# Patient Record
Sex: Male | Born: 1960 | Race: White | Hispanic: No | Marital: Single | Smoking: Never smoker
Health system: Southern US, Community
[De-identification: ages and names within clinical notes are randomized; demographics above are authoritative.]

---

## 2005-11-07 ENCOUNTER — Ambulatory Visit: Payer: Self-pay | Admitting: Family Medicine

## 2006-08-18 ENCOUNTER — Encounter: Admission: RE | Admit: 2006-08-18 | Discharge: 2006-08-18 | Payer: Self-pay | Admitting: Occupational Medicine

## 2008-08-17 ENCOUNTER — Encounter: Admission: RE | Admit: 2008-08-17 | Discharge: 2008-08-17 | Payer: Self-pay | Admitting: Occupational Medicine

## 2010-09-19 IMAGING — CR DG CHEST 2V
2 series · 2 of 2 positions shown · non-contrast
Comparison: 08/18/2006

CLINICAL DATA: Cough/physical clearance

CHEST - 2 VIEW

[view not recorded (1 of 2)]
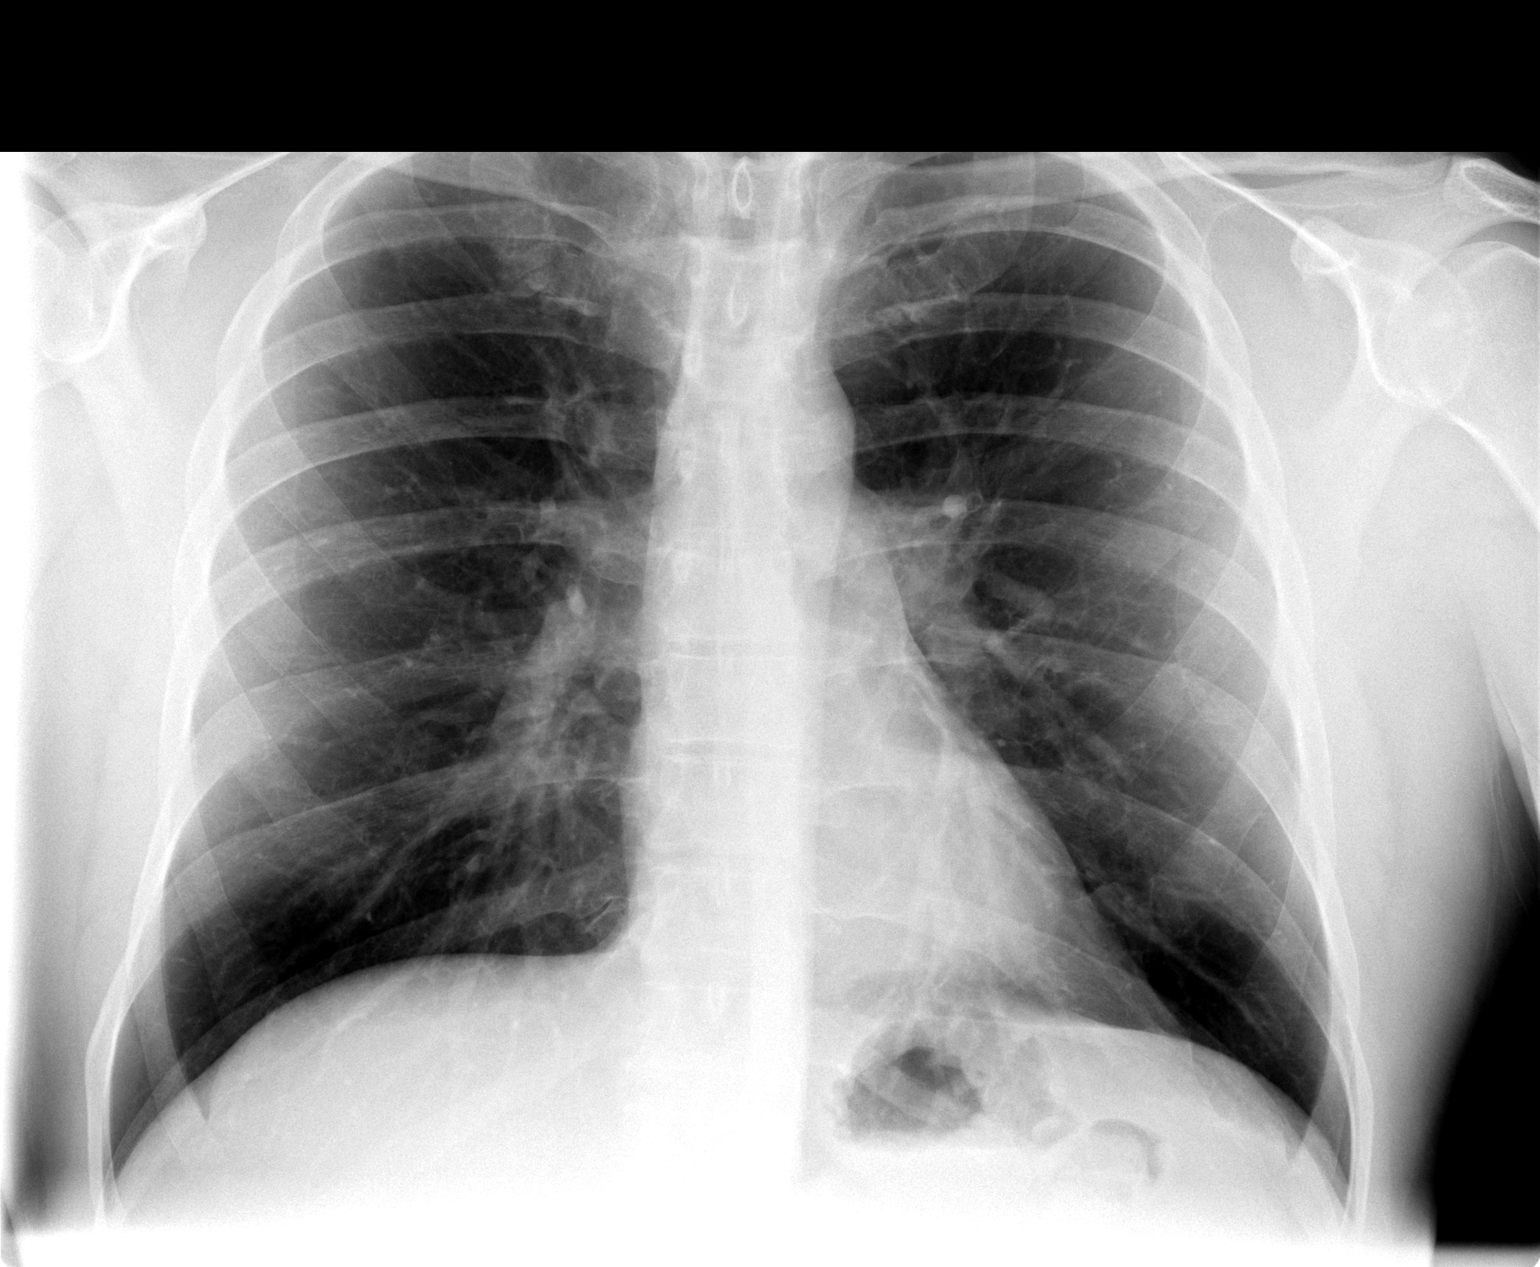

[view not recorded (2 of 2)]
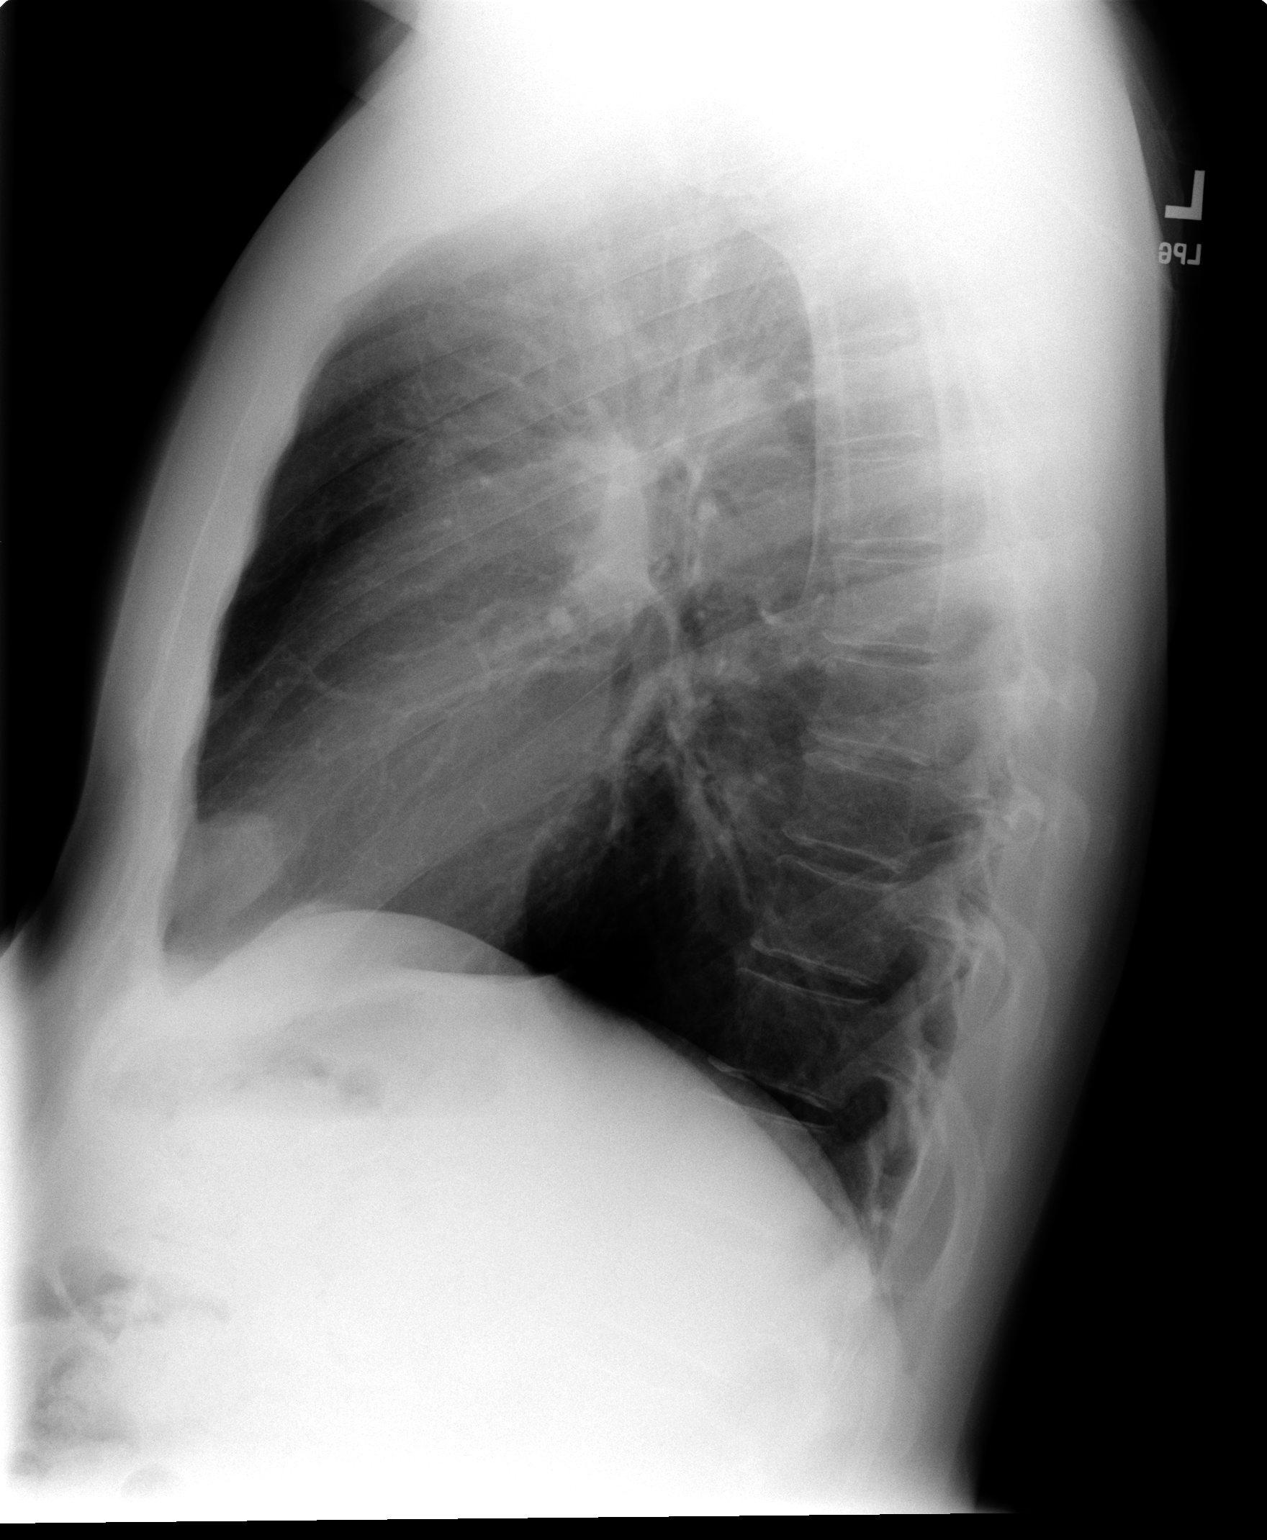

[2 of 2 positions shown; findings below may reference images not displayed]

FINDINGS: Heart and mediastinal contours normal.  Lungs mildly
hyperaerated but clear.  No change.  Osseous structures intact.
IMPRESSION: The lungs mildly hyperaerated - no active disease or interval
change.

## 2016-01-28 ENCOUNTER — Encounter (HOSPITAL_BASED_OUTPATIENT_CLINIC_OR_DEPARTMENT_OTHER): Payer: Self-pay | Admitting: Emergency Medicine

## 2016-01-28 ENCOUNTER — Emergency Department (HOSPITAL_BASED_OUTPATIENT_CLINIC_OR_DEPARTMENT_OTHER)
Admission: EM | Admit: 2016-01-28 | Discharge: 2016-01-29 | Disposition: A | Discharge status: Home / Self Care | Payer: BLUE CROSS/BLUE SHIELD | Attending: Emergency Medicine | Admitting: Emergency Medicine

## 2016-01-28 DIAGNOSIS — S6992XA Unspecified injury of left wrist, hand and finger(s), initial encounter: Secondary | ICD-10-CM

## 2016-01-28 DIAGNOSIS — Y929 Unspecified place or not applicable: Secondary | ICD-10-CM | POA: Insufficient documentation

## 2016-01-28 DIAGNOSIS — Y999 Unspecified external cause status: Secondary | ICD-10-CM | POA: Diagnosis not present

## 2016-01-28 DIAGNOSIS — S60451A Superficial foreign body of left index finger, initial encounter: Secondary | ICD-10-CM | POA: Insufficient documentation

## 2016-01-28 DIAGNOSIS — W228XXA Striking against or struck by other objects, initial encounter: Secondary | ICD-10-CM | POA: Insufficient documentation

## 2016-01-28 DIAGNOSIS — Y939 Activity, unspecified: Secondary | ICD-10-CM | POA: Insufficient documentation

## 2016-01-28 NOTE — ED Triage Notes (Signed)
Fishing hook stuck in left index finger

## 2016-01-29 ENCOUNTER — Emergency Department (HOSPITAL_BASED_OUTPATIENT_CLINIC_OR_DEPARTMENT_OTHER): Payer: BLUE CROSS/BLUE SHIELD

## 2016-01-29 MED ORDER — DOXYCYCLINE HYCLATE 100 MG PO CAPS: 100.0000 mg | ORAL_CAPSULE | Freq: Two times a day (BID) | ORAL | 0 refills | Status: AC

## 2016-01-29 MED ORDER — TETANUS-DIPHTH-ACELL PERTUSSIS 5-2.5-18.5 LF-MCG/0.5 IM SUSP
0.5000 mL | Freq: Once | INTRAMUSCULAR | Status: AC
  Administered 2016-01-29: 0.5 mL via INTRAMUSCULAR
  Filled 2016-01-29: qty 0.5

## 2016-01-29 MED ORDER — DOXYCYCLINE HYCLATE 100 MG PO TABS
100.0000 mg | ORAL_TABLET | Freq: Once | ORAL | Status: AC
  Administered 2016-01-29: 100 mg via ORAL
  Filled 2016-01-29: qty 1

## 2016-01-29 MED ORDER — LIDOCAINE HCL 2 % IJ SOLN
5.0000 mL | Freq: Once | INTRAMUSCULAR | Status: AC
  Administered 2016-01-29: 100 mg
  Filled 2016-01-29: qty 20

## 2016-01-29 MED ORDER — HYDROCODONE-ACETAMINOPHEN 5-325 MG PO TABS
1.0000 | ORAL_TABLET | Freq: Once | ORAL | Status: AC
  Administered 2016-01-29: 1 via ORAL
  Filled 2016-01-29: qty 1

## 2016-01-29 NOTE — Discharge Instructions (Signed)
PLEASE RETURN IF YOU HAVE ANY FEVER, DRAINAGE OR REDNESS OF THE FINGER OVER THE NEXT 24-48 HOURS

## 2016-01-29 NOTE — ED Provider Notes (Signed)
[facility] Note   CSN: [id number] Arrival date & time: 01/28/16  2027 By signing my name below, I, Levon HedgerElizabeth Hall, attest that this documentation has been prepared under the direction and in the presence of Zadie Rhineonald Westlynn Fifer, MD . Electronically Signed: Levon HedgerElizabeth Hall, Scribe. 01/29/2016. 12:16 AM.   History   Chief Complaint Chief Complaint  Patient presents with  . Finger Injury    HPI Willie Todd is a 55 y.o. male who presents to the Emergency Department s/p injury to left index finger sustained while fishing around 7:45 this evening. He states that when he went to remove his hook from a fish, the bottom hook became lodged in his left index finger. Per pt, he tried to remove the hook, but it is stuck. Pt is unsure of last tetanus. Pt is allergic to bactrim and Keflex, but denies any other drug allergies.  The history is provided by the patient. No language interpreter was used.   History reviewed. No pertinent past medical history.  There are no active problems to display for this patient.   History reviewed. No pertinent surgical history.   Home Medications    Prior to Admission medications   Not on File    Family History History reviewed. No pertinent family history.  Social History Social History  Substance Use Topics  . Smoking status: Never Smoker  . Smokeless tobacco: Never Used  . Alcohol use Yes     Allergies   Bactrim [sulfamethoxazole-trimethoprim] and Keflex [cephalexin]   Review of Systems Review of Systems  Constitutional: Negative for fever.  Skin: Positive for wound.    Physical Exam Updated Vital Signs BP 136/80   Pulse 92   Temp 98.1 F (36.7 C) (Oral)   Resp 18   Ht 5\' 11"  (1.803 m)   Wt 197 lb (89.4 kg)   SpO2 97%   BMI 27.48 kg/m   Physical Exam CONSTITUTIONAL: Well developed/well nourished HEAD: Normocephalic/atraumatic ENMT: Mucous membranes moist NECK: supple no meningeal signs CV: S1/S2 noted, no  murmurs/rubs/gallops noted LUNGS: Lungs are clear to auscultation bilaterally, no apparent distress NEURO: Pt is awake/alert/appropriate, moves all extremitiesx4.  No facial droop.   EXTREMITIES: pulses normal/equal, full ROM. See photo. SKIN: warm, color normal PSYCH: no abnormalities of mood noted, alert and oriented to situation      ED Treatments / Results  DIAGNOSTIC STUDIES:  Oxygen Saturation is 97% on RA, normal by my interpretation.    COORDINATION OF CARE:  12:12 AM Will order DG finger left. Discussed treatment plan with pt at bedside and pt agreed to plan.   Labs (all labs ordered are listed, but only abnormal results are displayed) Labs Reviewed - No data to display  EKG  EKG Interpretation None       Radiology Dg Finger Index Left  Result Date: 01/29/2016 CLINICAL DATA:  Fishing hook stuck in left index finger EXAM: LEFT INDEX FINGER 2+V COMPARISON:  None. FINDINGS: There is a 3 pronged fishing hook with 1 prong piercing the soft tissues of the lateral aspect of the index finger. The barbed portion of the hook extends below the skin surface. The hook does not contact the bone surface. IMPRESSION: Fishing hook with single prong piercing the lateral soft tissues of the index finger. No bony abnormality. Electronically Signed   By: Deatra RobinsonKevin  Herman M.D.   On: 01/29/2016 00:51    Procedures .Foreign Body Removal Date/Time: 01/29/2016 1:41 AM Performed by: Zadie RhineWICKLINE, Tia Gelb Authorized by: Zadie RhineWICKLINE, Sundus Pete  Consent:  Verbal consent obtained. Consent given by: patient Patient understanding: patient states understanding of the procedure being performed Patient identity confirmed: verbally with patient Body area: skin General location: upper extremity Location details: left index finger Anesthesia: digital block Complexity: complex 1 objects recovered. Objects recovered: FISH HOOK Post-procedure assessment: foreign body removed Patient tolerance: Patient tolerated  the procedure well with no immediate complications Comments: SUCCESSFUL REMOVAL OF FISH HOOK WITHOUT COMPLICATIONS I PUSHED BARBED END THROUGH AND CUT BARBED END OFF WITH WIRE CUTTERS AND THEN EXTRACTED FISH HOOK.  PT TOLERATED WELL  NERVE BLOCK Performed by: Joya GaskinsWICKLINE,Lubertha Leite W Consent: Verbal consent obtained. Required items: required devices, and special equipment available Time out: Immediately prior to procedure a "time out" was called to verify the correct patient, procedure, equipment, support staff and site/side marked as required.  Indication: FOREIGN BODY REMOVAL Nerve block body site: LEFT INDEX FINGER  Preparation: Patient was prepped and draped in the usual sterile fashion. Needle gauge: 24 G Location technique: anatomical landmarks  Local anesthetic: LIDOCAINE  Anesthetic total: 3 ml Outcome: pain improved Patient tolerance: Patient tolerated the procedure well with no immediate complications.   Medications Ordered in ED Medications  lidocaine (XYLOCAINE) 2 % (with pres) injection 100 mg (not administered)  doxycycline (VIBRA-TABS) tablet 100 mg (100 mg Oral Given 01/29/16 0044)  HYDROcodone-acetaminophen (NORCO/VICODIN) 5-325 MG per tablet 1 tablet (1 tablet Oral Given 01/29/16 0044)  Tdap (BOOSTRIX) injection 0.5 mL (0.5 mLs Intramuscular Given 01/29/16 0045)     Initial Impression / Assessment and Plan / ED Course  I have reviewed the triage vital signs and the nursing notes.  Pertinent imaging results that were available during my care of the patient were reviewed by me and considered in my medical decision making (see chart for details).  Clinical Course    PT TOLERATED WELL WILL PLACE ON DOXYCYCLINE WE DISCUSSED STRICT RETURN PRECAUTIONS  Final Clinical Impressions(s) / ED Diagnoses   Final diagnoses:  Fish hook injury of finger, left, initial encounter  I personally performed the services described in this documentation, which was scribed in my  presence. The recorded information has been reviewed and is accurate.      New Prescriptions New Prescriptions   DOXYCYCLINE (VIBRAMYCIN) 100 MG CAPSULE    Take 1 capsule (100 mg total) by mouth 2 (two) times daily. One po bid x 7 days     Zadie Rhineonald Tykesha Konicki, MD 01/29/16 [phone]

## 2018-03-02 IMAGING — CR DG FINGER INDEX 2+V*L*
3 series · 3 of 3 positions shown · non-contrast
Comparison: None.

CLINICAL DATA: Fishing hook stuck in left index finger

EXAM:
LEFT INDEX FINGER 2+V

[x finger pa left]
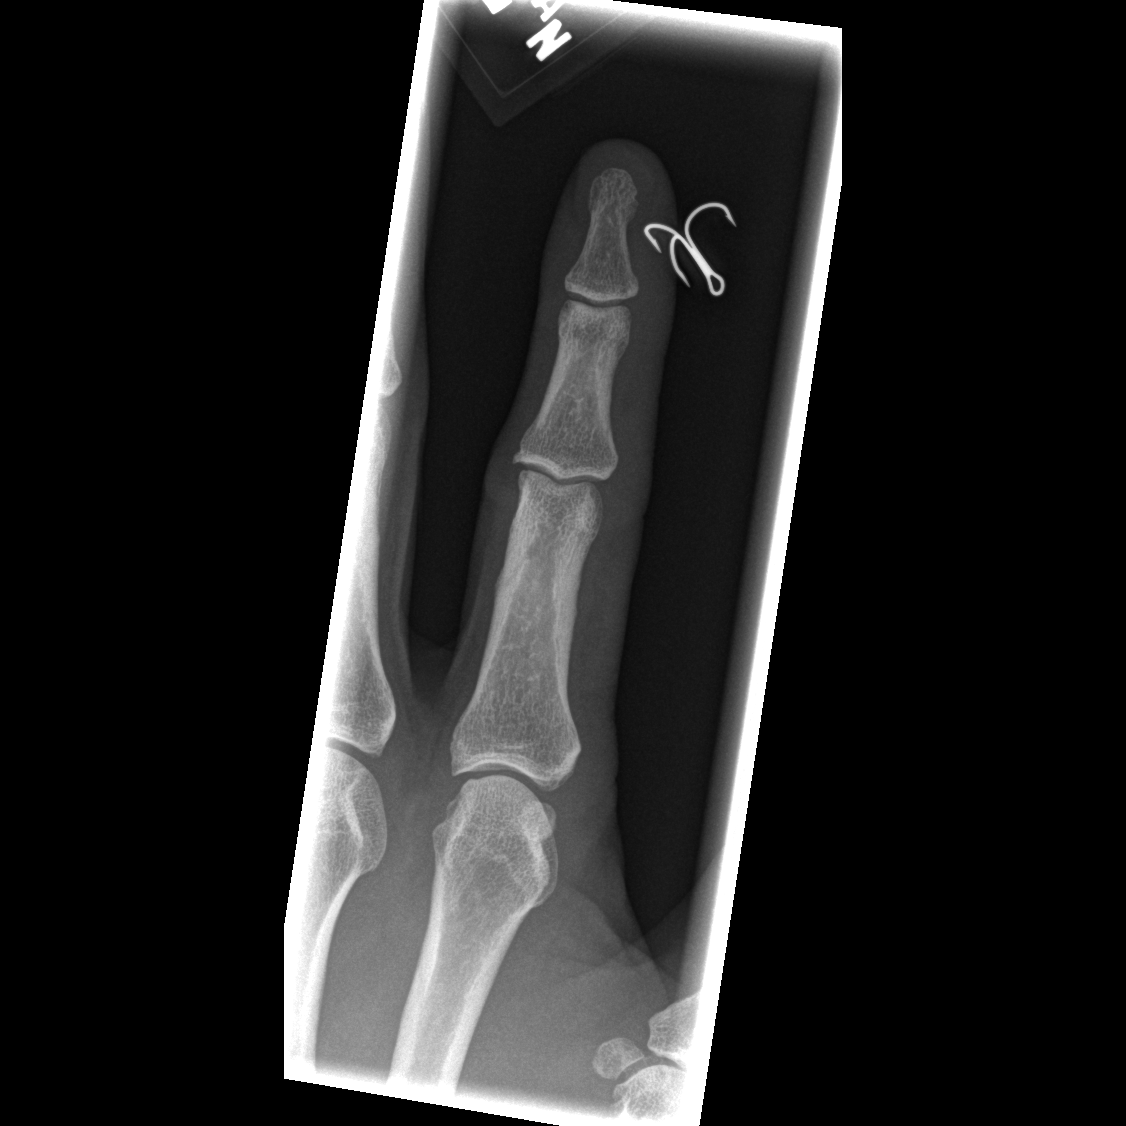

[x finger obl. left]
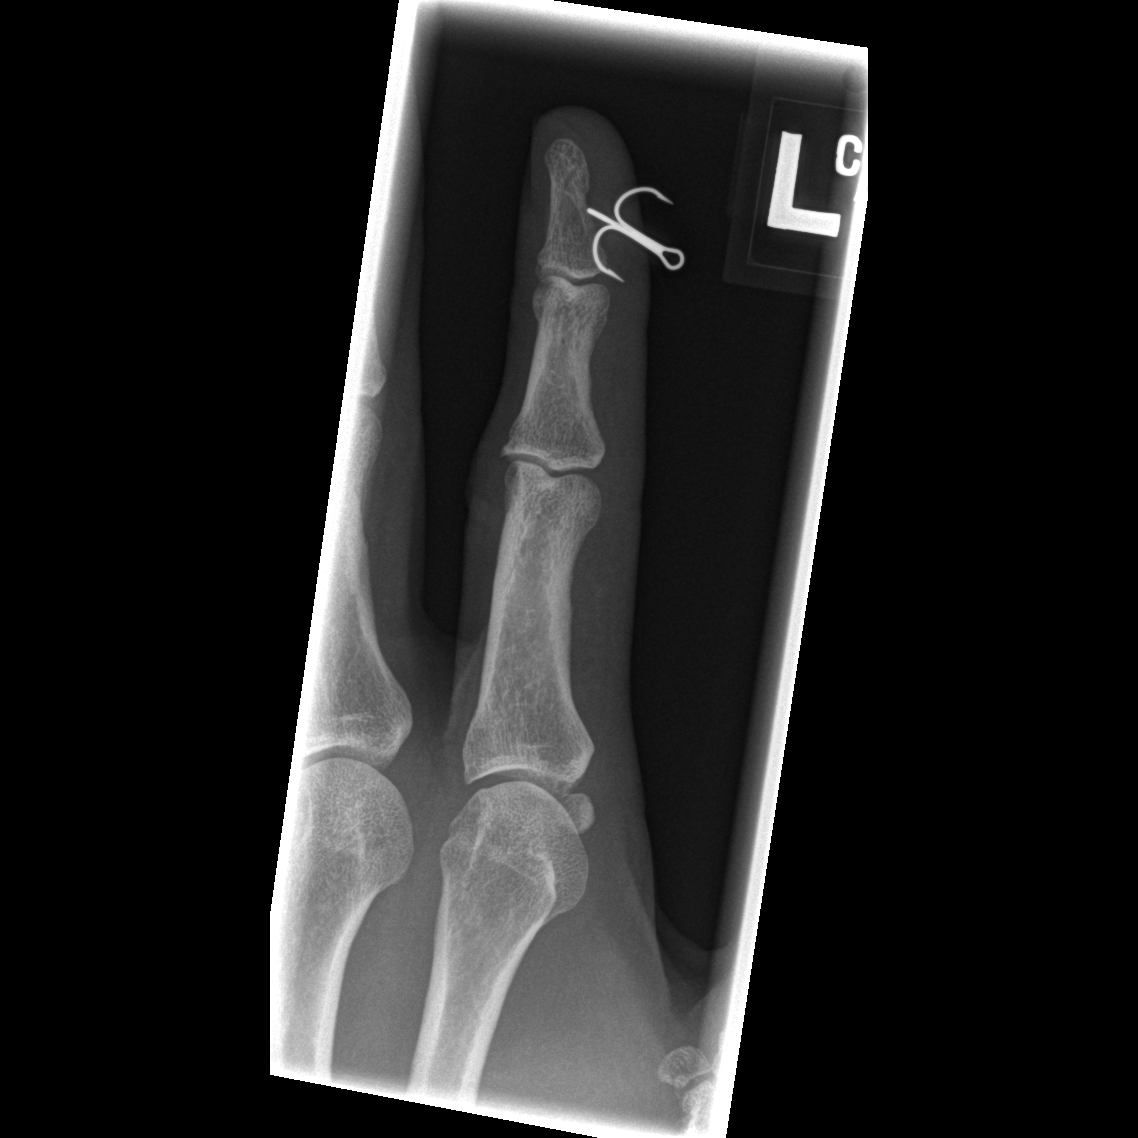

[x finger lateral left]
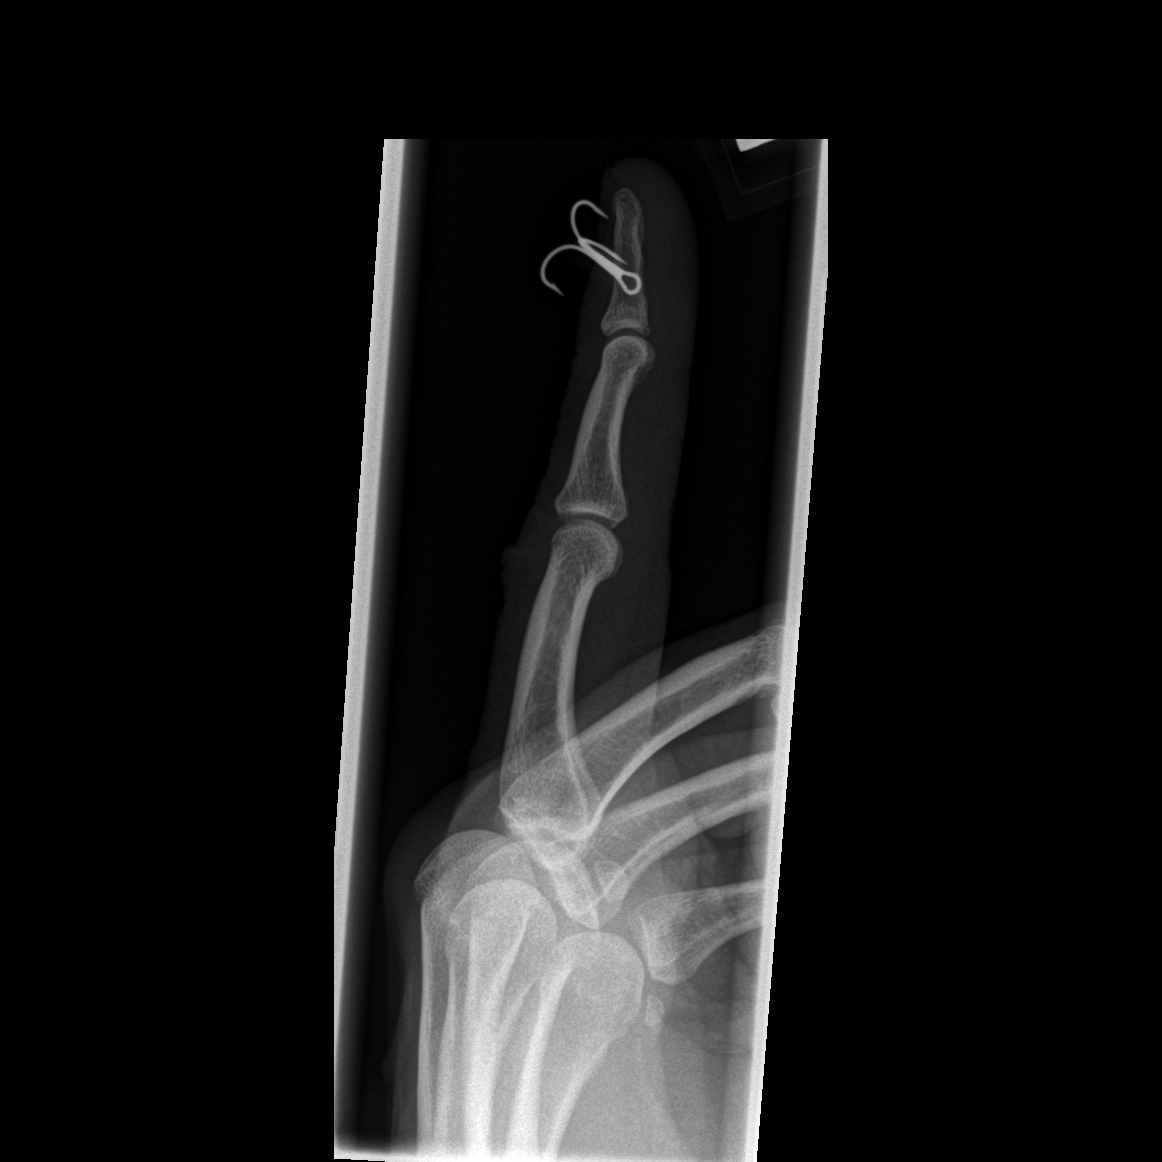

[3 of 3 positions shown; findings below may reference images not displayed]

FINDINGS: There is a 3 pronged fishing hook with 1 prong piercing the soft
tissues of the lateral aspect of the index finger. The barbed
portion of the hook extends below the skin surface. The hook does
not contact the bone surface.
IMPRESSION: Fishing hook with single prong piercing the lateral soft tissues of
the index finger. No bony abnormality.
